# Patient Record
Sex: Male | Born: 1995
Health system: Southern US, Community
[De-identification: ages and names within clinical notes are randomized; demographics above are authoritative.]

## PROBLEM LIST (undated history)

## (undated) DIAGNOSIS — S83511A Sprain of anterior cruciate ligament of right knee, initial encounter: Secondary | ICD-10-CM

## (undated) DIAGNOSIS — S83411A Sprain of medial collateral ligament of right knee, initial encounter: Secondary | ICD-10-CM

## (undated) DIAGNOSIS — F909 Attention-deficit hyperactivity disorder, unspecified type: Secondary | ICD-10-CM

## (undated) HISTORY — PX: MYRINGOTOMY: SHX2060

---
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
## 2017-02-22 IMAGING — MR MR KNEE*R* W/O CM
4 of 7 series · 19 of 40 positions shown · non-contrast
Comparison: None.

CLINICAL DATA: Right knee pain.  Injured playing football.

EXAM:
MRI OF THE RIGHT KNEE WITHOUT CONTRAST
TECHNIQUE: Multiplanar, multisequence MR imaging of the knee was performed. No
intravenous contrast was administered.

[Series 2: PD fat-sat · axial · 4.0mm · 0.27mm/px · z∈[-81,+33]mm · 7 of 24 slices shown (1 of 4)]
[im 1/24]
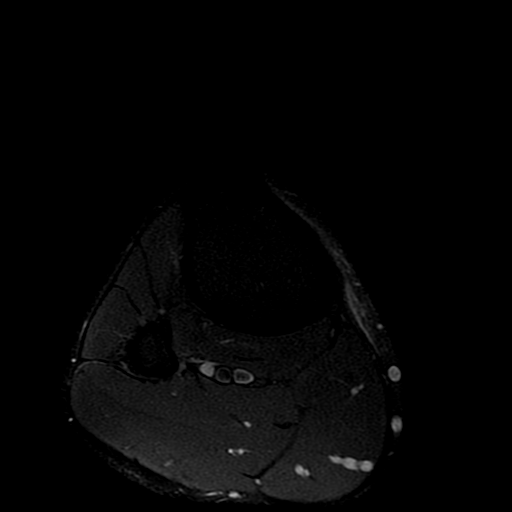
[im 4/24]
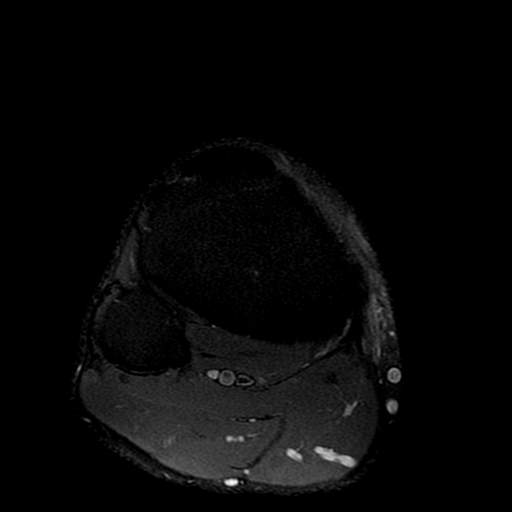
[im 8/24]
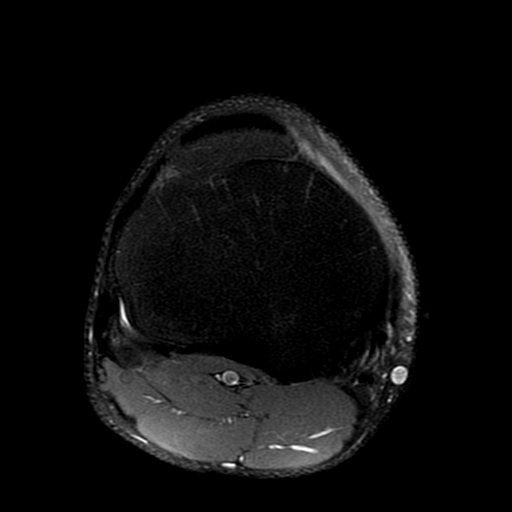
[im 12/24]
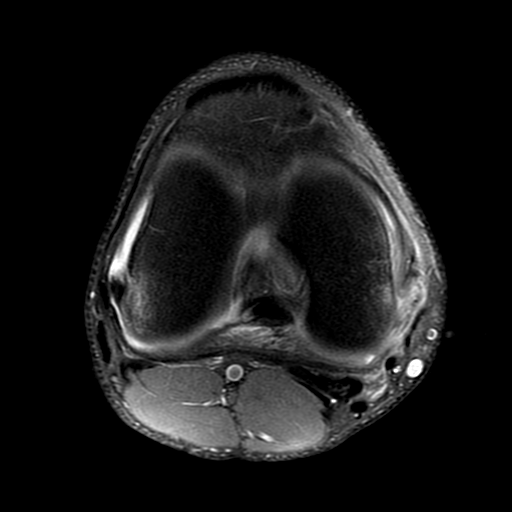
[im 16/24]
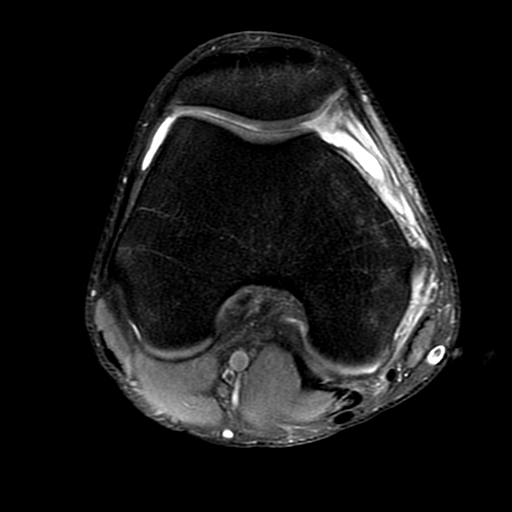
[im 20/24]
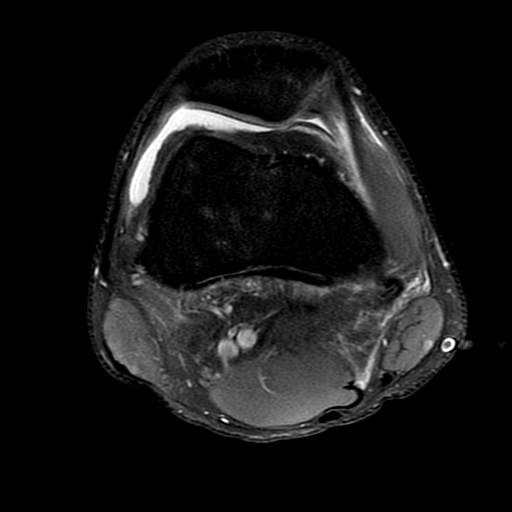
[im 24/24]
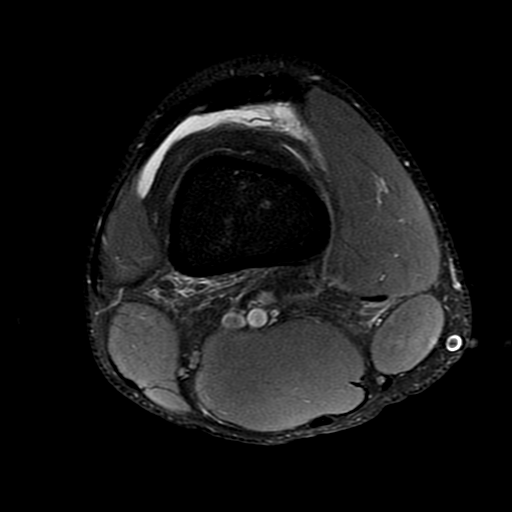

[Series 4: PD fat-sat · coronal · 4.0mm · 0.31mm/px · 6 of 24 slices shown (2 of 4)]
[im 1/24]
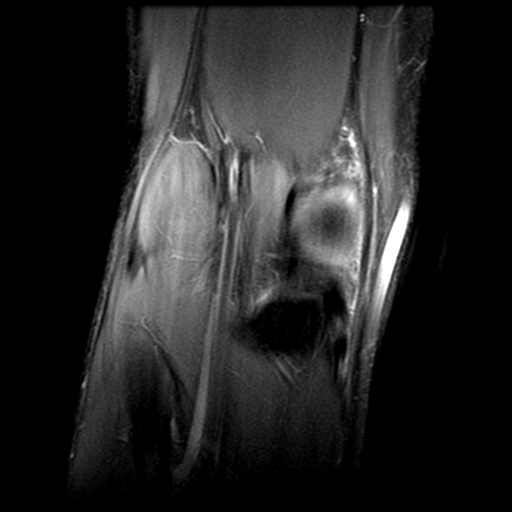
[im 5/24]
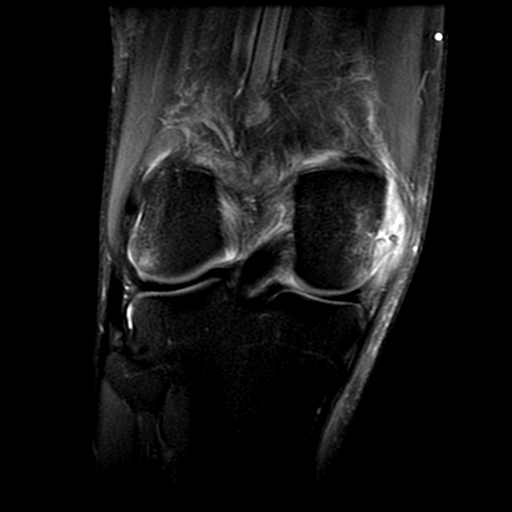
[im 10/24]
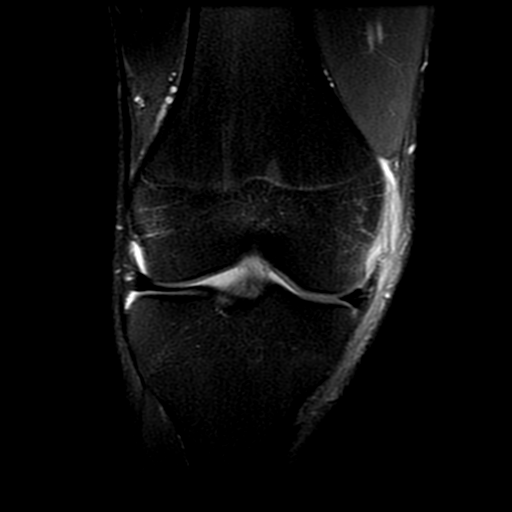
[im 14/24]
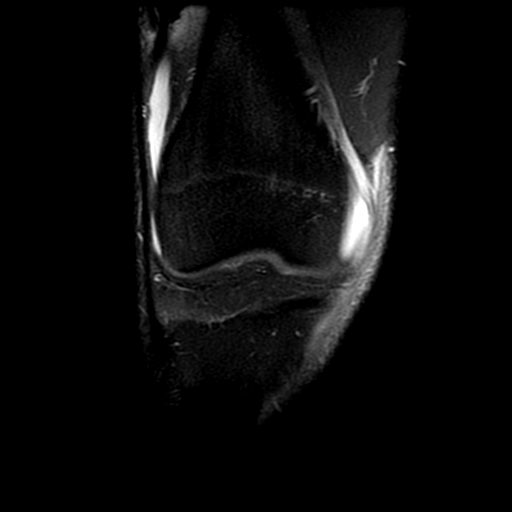
[im 19/24]
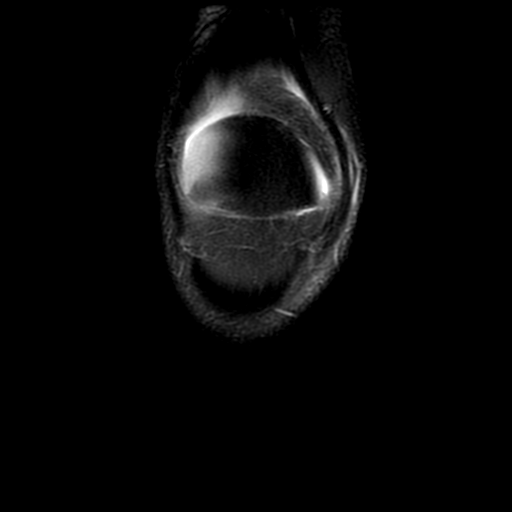
[im 24/24]
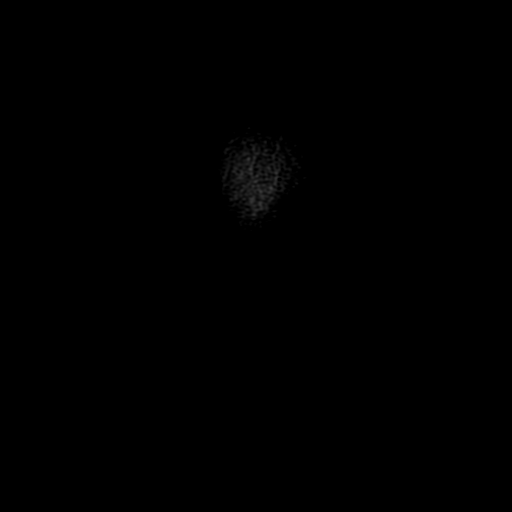

[Series 5: PD fat-sat · sagittal · 4.0mm · 0.31mm/px · 3 of 26 slices shown (3 of 4)]
[im 5/26]
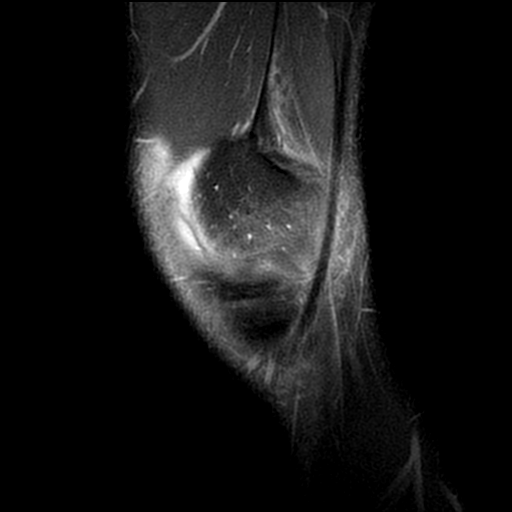
[im 13/26]
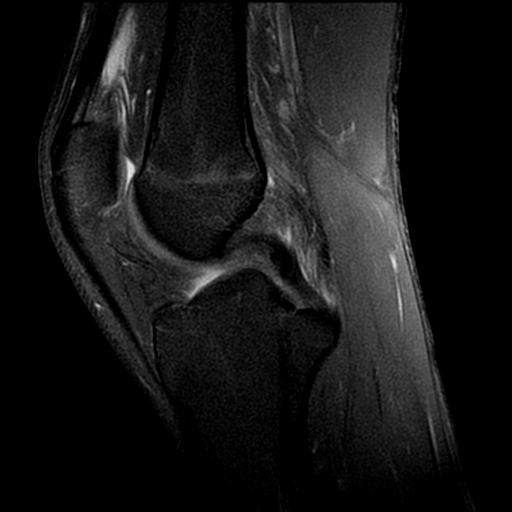
[im 21/26]
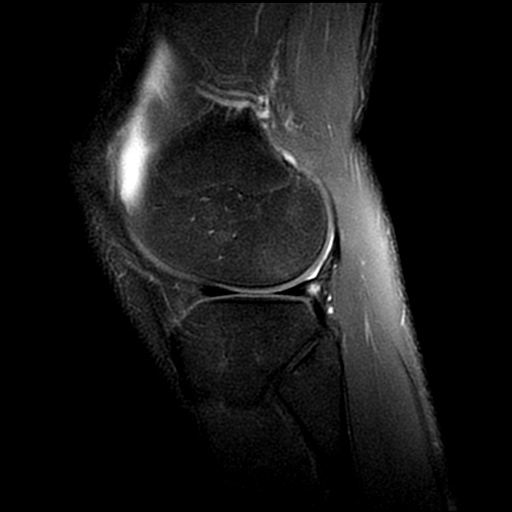

[Series 7: PD fat-sat · coronal · 2.0mm · 0.31mm/px · 3 of 16 slices shown (4 of 4)]
[im 1/16]
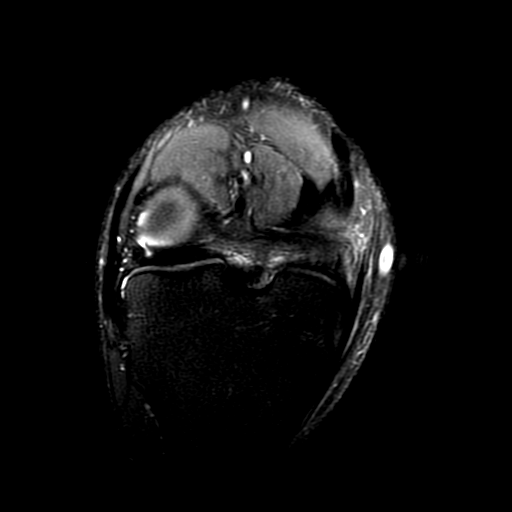
[im 11/16]
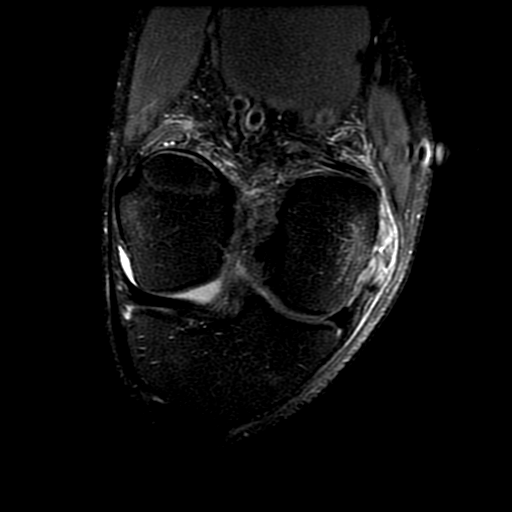
[im 16/16]
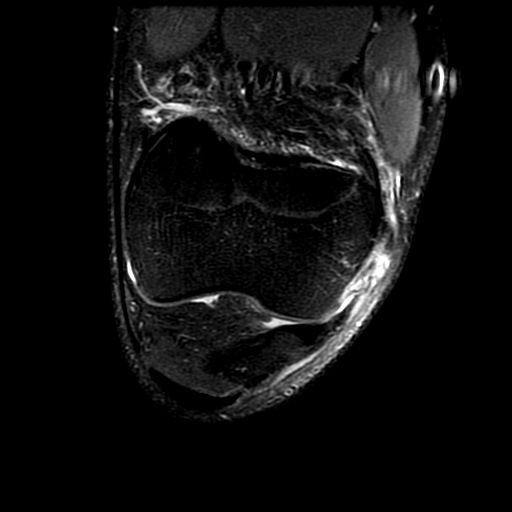

[19 of 40 positions shown; findings below may reference images not displayed]

FINDINGS: MENISCI

Medial meniscus:  Intact.

Lateral meniscus:  Intact.

LIGAMENTS

Cruciates: Increased signal in the proximal ACL with intact fibers
identified concerning for a partial tear versus severe strain.
Intact PCL.

Collaterals: Complete tear of the proximal MCL. Intact lateral
collateral ligament complex.

CARTILAGE

Patellofemoral:  No chondral defect.

Medial:  No chondral defect.

Lateral:  No chondral defect.

Joint: Small joint effusion. Normal Hoffa's fat. No plical
thickening.

Popliteal Fossa:  No Baker cyst.  Intact popliteus tendon.

Extensor Mechanism:  Intact.

Bones: Mild marrow edema in the peripheral lateral femoral condyle.
Mild subcortical marrow changes at the MCL origin. No other crash
that no acute fracture or dislocation.
IMPRESSION: 1. Increased signal in the proximal ACL with intact fibers
identified concerning for a partial tear versus severe strain.
2. Complete tear of the proximal MCL.

## 2018-09-06 DIAGNOSIS — Z139 Encounter for screening, unspecified: Secondary | ICD-10-CM | POA: Diagnosis not present

## 2018-09-06 DIAGNOSIS — Z0184 Encounter for antibody response examination: Secondary | ICD-10-CM | POA: Diagnosis not present

## 2018-09-06 DIAGNOSIS — Z23 Encounter for immunization: Secondary | ICD-10-CM | POA: Diagnosis not present

## 2018-09-08 DIAGNOSIS — Z23 Encounter for immunization: Secondary | ICD-10-CM | POA: Diagnosis not present

## 2018-10-04 DIAGNOSIS — Z111 Encounter for screening for respiratory tuberculosis: Secondary | ICD-10-CM | POA: Diagnosis not present

## 2018-10-04 DIAGNOSIS — Z23 Encounter for immunization: Secondary | ICD-10-CM | POA: Diagnosis not present

## 2019-07-08 DIAGNOSIS — Z20828 Contact with and (suspected) exposure to other viral communicable diseases: Secondary | ICD-10-CM | POA: Diagnosis not present

## 2019-07-26 DIAGNOSIS — Z20828 Contact with and (suspected) exposure to other viral communicable diseases: Secondary | ICD-10-CM | POA: Diagnosis not present
